# Patient Record
Sex: Female | Born: 1995 | Race: White | Hispanic: No | Marital: Single | Smoking: Never smoker
Health system: Southern US, Community
[De-identification: ages and names within clinical notes are randomized; demographics above are authoritative.]

---
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
## 2017-04-05 ENCOUNTER — Ambulatory Visit (INDEPENDENT_AMBULATORY_CARE_PROVIDER_SITE_OTHER): Payer: BLUE CROSS/BLUE SHIELD | Admitting: Family Medicine

## 2017-04-05 VITALS — BP 124/77 | HR 93 | Temp 98.9°F | Resp 14

## 2017-04-05 DIAGNOSIS — J029 Acute pharyngitis, unspecified: Secondary | ICD-10-CM

## 2017-04-05 DIAGNOSIS — R109 Unspecified abdominal pain: Secondary | ICD-10-CM

## 2017-04-05 LAB — POCT URINE PREGNANCY: Preg Test, Ur: NEGATIVE

## 2017-04-05 LAB — POCT RAPID STREP A (OFFICE): RAPID STREP A SCREEN: NEGATIVE

## 2017-04-05 NOTE — Progress Notes (Signed)
Patient presents today with symptoms of lower abdominal pain. Patient states that her last menstrual cycle was a week ago. She states that she has had the lower abdominal pain for about 3 days. She denies a focal area of pain. She denies any diarrhea or vomiting. She has felt nauseous at times. She denies any constipation or vaginal discharge. She is sexually active however does use protection. She does also have a mild sore throat. She rates this a 4 out of 10 on the pain scale. Her abdominal pain is about 3 out of 10 on the pain scale. She denies any history of mono in the past. She has had postnasal drip related to seasonal allergies. She denies any urinary symptoms such as frequency or dysuria. She has had some decreased appetite.  ROS: Negative except mentioned above. Vitals as per Epic.  GENERAL: NAD HEENT: mild pharyngeal erythema, no exudate, no erythema of TMs, mild right cervical LAD RESP: CTA B CARD: RRR ABD: +BS, NT, no rebound or guarding, no flank tenderness  NEURO: CN II-XII grossly intact   Urine Dip - leukocytes negative, nitrites negative, 1+ protein, pH 6.5, negative blood, specific gravity 1.010, trace ketones, negative glucose  A/P: Abdominal pain, sore throat - rapid strep test was negative in the office, urine dip was not indicative of a UTI, urine pregnancy was negative, will send further labs. I've encourage the patient to monitor her symptoms closely and to probably go to the ER if her symptoms were to worsen such as right lower quadrant pain, vomiting. No athletic activity for now. Tylenol when necessary.

## 2017-04-08 LAB — COMPREHENSIVE METABOLIC PANEL
ALK PHOS: 89 IU/L (ref 39–117)
ALT: 15 IU/L (ref 0–32)
AST: 22 IU/L (ref 0–40)
Albumin/Globulin Ratio: 1.5 (ref 1.2–2.2)
Albumin: 4.4 g/dL (ref 3.5–5.5)
BUN/Creatinine Ratio: 11 (ref 9–23)
BUN: 10 mg/dL (ref 6–20)
Bilirubin Total: 0.3 mg/dL (ref 0.0–1.2)
CO2: 22 mmol/L (ref 18–29)
CREATININE: 0.9 mg/dL (ref 0.57–1.00)
Calcium: 9.1 mg/dL (ref 8.7–10.2)
Chloride: 99 mmol/L (ref 96–106)
GFR calc Af Amer: 107 mL/min/{1.73_m2} (ref >59)
GFR calc non Af Amer: 93 mL/min/{1.73_m2} (ref >59)
GLOBULIN, TOTAL: 2.9 g/dL (ref 1.5–4.5)
GLUCOSE: 90 mg/dL (ref 65–99)
Potassium: 4.2 mmol/L (ref 3.5–5.2)
SODIUM: 136 mmol/L (ref 134–144)
Total Protein: 7.3 g/dL (ref 6.0–8.5)

## 2017-04-08 LAB — CBC WITH DIFFERENTIAL/PLATELET
BASOS ABS: 0 10*3/uL (ref 0.0–0.2)
Basos: 1 %
EOS (ABSOLUTE): 0 10*3/uL (ref 0.0–0.4)
EOS: 0 %
Hematocrit: 36.6 % (ref 34.0–46.6)
Hemoglobin: 12.5 g/dL (ref 11.1–15.9)
Immature Grans (Abs): 0 10*3/uL (ref 0.0–0.1)
Immature Granulocytes: 0 %
LYMPHS ABS: 0.5 10*3/uL — AB (ref 0.7–3.1)
Lymphs: 18 %
MCH: 29.8 pg (ref 26.6–33.0)
MCHC: 34.2 g/dL (ref 31.5–35.7)
MCV: 87 fL (ref 79–97)
Monocytes Absolute: 0.3 10*3/uL (ref 0.1–0.9)
Monocytes: 10 %
Neutrophils Absolute: 2.1 10*3/uL (ref 1.4–7.0)
Neutrophils: 71 %
PLATELETS: 170 10*3/uL (ref 150–379)
RBC: 4.19 x10E6/uL (ref 3.77–5.28)
RDW: 13 % (ref 12.3–15.4)
WBC: 3 10*3/uL — AB (ref 3.4–10.8)

## 2017-04-08 LAB — URINALYSIS, ROUTINE W REFLEX MICROSCOPIC
Bilirubin, UA: NEGATIVE
GLUCOSE, UA: NEGATIVE
KETONES UA: NEGATIVE
Leukocytes, UA: NEGATIVE
NITRITE UA: NEGATIVE
Protein, UA: NEGATIVE
RBC, UA: NEGATIVE
SPEC GRAV UA: 1.02 (ref 1.005–1.030)
Urobilinogen, Ur: 0.2 mg/dL (ref 0.2–1.0)
pH, UA: 5.5 (ref 5.0–7.5)

## 2017-04-08 LAB — CULTURE, GROUP A STREP: Strep A Culture: NEGATIVE

## 2017-04-08 LAB — URINE CULTURE

## 2017-04-08 LAB — MONONUCLEOSIS SCREEN: MONO SCREEN: NEGATIVE

## 2017-04-09 ENCOUNTER — Ambulatory Visit (INDEPENDENT_AMBULATORY_CARE_PROVIDER_SITE_OTHER): Payer: BLUE CROSS/BLUE SHIELD | Admitting: Family Medicine

## 2017-04-09 ENCOUNTER — Encounter: Payer: Self-pay | Admitting: Family Medicine

## 2017-04-09 VITALS — BP 117/74 | HR 67 | Temp 97.7°F | Resp 14

## 2017-04-09 DIAGNOSIS — J029 Acute pharyngitis, unspecified: Secondary | ICD-10-CM

## 2017-04-09 NOTE — Progress Notes (Signed)
Patient presents today with persistent symptoms of sore throat, fatigue, fever. [facility] on [facility] and was given Ceftin for a sinus infection. When patient saw me on Thursday her labs reflected likely viral infection. Her Monospot test was negative. Her throat culture was negative. Patient has been taking Tylenol and Ibuprofen. She denies any previous allergies to any antibiotics in the past. She denies any abdominal pain.  ROS: Negative except mentioned above. Vitals as per Epic. GENERAL: NAD HEENT: Mild to moderate pharyngeal erythema, no exudate, no erythema of TMs, mild right cervical LAD RESP: CTA B CARD: RRR  ABD: Positive bowel sounds, nontender, no organomegaly appreciated SKIN: fine generalized erythematous rash on body  NEURO: CN II-XII grossly intact   A/P: Pharyngitis, Fatigue - rash appears to be likely related to viral exanthem and the results of the virus being treated with an antibiotic, discussed this with patient, likely not an allergy to Ceftin however would not use medication again, this was discussed with patient, will repeat CBC and will do EBV titers. Encourage patient to take Tylenol and Ibuprofen still as needed. No athletic activity for now until EBV titers have been reviewed. Seek medical attention if symptoms persist or worsen as discussed.

## 2017-04-10 LAB — CBC WITH DIFFERENTIAL/PLATELET
BASOS ABS: 0.2 10*3/uL (ref 0.0–0.2)
BASOS: 5 %
EOS (ABSOLUTE): 0.1 10*3/uL (ref 0.0–0.4)
Eos: 2 %
HEMATOCRIT: 37 % (ref 34.0–46.6)
Hemoglobin: 12.8 g/dL (ref 11.1–15.9)
IMMATURE GRANS (ABS): 0 10*3/uL (ref 0.0–0.1)
Immature Granulocytes: 0 %
LYMPHS ABS: 1.4 10*3/uL (ref 0.7–3.1)
LYMPHS: 42 %
MCH: 29.2 pg (ref 26.6–33.0)
MCHC: 34.6 g/dL (ref 31.5–35.7)
MCV: 85 fL (ref 79–97)
MONOCYTES: 14 %
Monocytes Absolute: 0.4 10*3/uL (ref 0.1–0.9)
Neutrophils Absolute: 1.2 10*3/uL — ABNORMAL LOW (ref 1.4–7.0)
Neutrophils: 37 %
Platelets: 180 10*3/uL (ref 150–379)
RBC: 4.38 x10E6/uL (ref 3.77–5.28)
RDW: 12.3 % (ref 12.3–15.4)
WBC: 3.3 10*3/uL — ABNORMAL LOW (ref 3.4–10.8)

## 2017-04-10 LAB — EPSTEIN-BARR VIRUS VCA ANTIBODY PANEL
EBV Early Antigen Ab, IgG: <9 U/mL (ref 0.0–8.9)
EBV VCA IgM: <36 U/mL (ref 0.0–35.9)

## 2017-09-20 ENCOUNTER — Ambulatory Visit (INDEPENDENT_AMBULATORY_CARE_PROVIDER_SITE_OTHER): Payer: BLUE CROSS/BLUE SHIELD | Admitting: Family Medicine

## 2017-09-20 DIAGNOSIS — M545 Low back pain, unspecified: Secondary | ICD-10-CM

## 2017-09-20 MED ORDER — CYCLOBENZAPRINE HCL 5 MG PO TABS: 5.0000 mg | ORAL_TABLET | Freq: Two times a day (BID) | ORAL | 0 refills | Status: AC | PRN

## 2017-09-20 MED ORDER — NAPROXEN 500 MG PO TABS: 500.0000 mg | ORAL_TABLET | Freq: Two times a day (BID) | ORAL | 0 refills | Status: AC

## 2017-09-20 NOTE — Progress Notes (Signed)
Patient presents today with symptoms of left-sided back pain. Patient states that she was elbowed in the back during a soccer game which then flew her body forward landing on her hands. Since that incident a few days ago she has noticed left-sided lower back pain and tightness. She denies any one particular movement causing her more discomfort. She denies any radicular symptoms into her lower extremities. She states that she did have sciatica when she was in high school but denies ever having any imaging such as x-ray or MRI before. She denies any incontinence, fever. She has not been taking medications for her symptoms. She denies any UTI symptoms or flank tenderness.  ROS: Negative except mentioned above.  GENERAL: NAD RESP: CTA B CARD: RRR MSK: No midline tenderness of lumbar area, mild paravertebral tenderness noted in the upper lumbar area left greater than right, no tenderness in the SI or gluteal region bilaterally, mild spasm, full range of motion, negative straight leg raise, mildly tight hamstrings, normal toe and heel walk, 5 out of 5 strength of lower extremities, NV intact NEURO: CN II-XII grossly intact   A/P: Lower back strain/spasm - will treat with Naprosyn and Flexeril for now, if symptoms do persist or worsen would then do imaging, patient addresses understanding of this, modify activity for now, advance activity as tolerated, work on hamstring flexibility and core strength, will seek medical attention if anything changes or worsens. Will discuss above with trainer.

## 2018-02-19 ENCOUNTER — Encounter: Payer: Self-pay | Admitting: Family Medicine

## 2018-02-19 ENCOUNTER — Ambulatory Visit (INDEPENDENT_AMBULATORY_CARE_PROVIDER_SITE_OTHER): Payer: BLUE CROSS/BLUE SHIELD | Admitting: Family Medicine

## 2018-02-19 VITALS — BP 126/77 | HR 62 | Temp 98.0°F | Resp 14

## 2018-02-19 DIAGNOSIS — J209 Acute bronchitis, unspecified: Secondary | ICD-10-CM

## 2018-02-19 MED ORDER — ALBUTEROL SULFATE HFA 108 (90 BASE) MCG/ACT IN AERS: 2.0000 | INHALATION_SPRAY | Freq: Four times a day (QID) | RESPIRATORY_TRACT | 0 refills | Status: DC | PRN

## 2018-02-19 MED ORDER — AZITHROMYCIN 250 MG PO TABS: ORAL_TABLET | ORAL | 0 refills | Status: AC

## 2018-02-19 NOTE — Progress Notes (Signed)
Patient presents today with symptoms of cough, nasal congestion, wheezing at times. Patient states that she's had the cough for about 2 weeks. She denies having fever, chills, myalgias, fatigue. She denies any sore throat, ear pain, abdominal pain, nausea, vomiting, diarrhea. She denies any history of asthma. She does have chronic allergies. She has not been taking any allergy medication. She has been trying to take Mucinex which has not been helping much. She does have colored mucus. She has noticed some chest tightness with exercise and increased coughing.  ROS: Negative except mentioned above. Vitals as per Epic. GENERAL: NAD HEENT: no pharyngeal erythema, no exudate, no erythema of TMs, no cervical LAD RESP: CTA B CARD: RRR NEURO: CN II-XII grossly intact   A/P: Bronchitis - will treat with Z-Pak, Albuterol Inhaler when necessary, Delsym when necessary, start Claritin for allergies, rest, hydration, seek medical attention if symptoms persist or worsen as discussed. Athletic activity as tolerated. No athletic activity or classic febrile.

## 2018-03-13 ENCOUNTER — Other Ambulatory Visit: Payer: Self-pay | Admitting: Family Medicine

## 2019-09-10 ENCOUNTER — Other Ambulatory Visit: Payer: Self-pay

## 2019-09-10 DIAGNOSIS — Z20822 Contact with and (suspected) exposure to covid-19: Secondary | ICD-10-CM

## 2019-09-12 LAB — NOVEL CORONAVIRUS, NAA: SARS-CoV-2, NAA: NOT DETECTED

## 2019-11-03 ENCOUNTER — Other Ambulatory Visit: Payer: Self-pay | Admitting: Family Medicine

## 2019-11-03 ENCOUNTER — Other Ambulatory Visit: Payer: Self-pay

## 2019-11-03 ENCOUNTER — Ambulatory Visit
Admission: RE | Admit: 2019-11-03 | Discharge: 2019-11-03 | Disposition: A | Discharge status: Home / Self Care | Payer: BC Managed Care – PPO | Source: Ambulatory Visit | Attending: Family Medicine | Admitting: Family Medicine

## 2019-11-03 DIAGNOSIS — S8992XA Unspecified injury of left lower leg, initial encounter: Secondary | ICD-10-CM

## 2019-11-04 ENCOUNTER — Other Ambulatory Visit: Payer: Self-pay | Admitting: Family Medicine

## 2019-11-04 DIAGNOSIS — S8992XA Unspecified injury of left lower leg, initial encounter: Secondary | ICD-10-CM

## 2019-11-07 ENCOUNTER — Other Ambulatory Visit: Payer: Self-pay

## 2019-11-07 ENCOUNTER — Ambulatory Visit
Admission: RE | Admit: 2019-11-07 | Discharge: 2019-11-07 | Disposition: A | Discharge status: Home / Self Care | Payer: BC Managed Care – PPO | Source: Ambulatory Visit | Attending: Family Medicine | Admitting: Family Medicine

## 2019-11-07 DIAGNOSIS — S8992XA Unspecified injury of left lower leg, initial encounter: Secondary | ICD-10-CM | POA: Insufficient documentation

## 2020-02-02 ENCOUNTER — Other Ambulatory Visit: Payer: Self-pay | Admitting: Family Medicine

## 2020-02-02 DIAGNOSIS — I400 Infective myocarditis: Secondary | ICD-10-CM

## 2020-02-02 DIAGNOSIS — Z8616 Personal history of COVID-19: Secondary | ICD-10-CM

## 2020-02-02 DIAGNOSIS — U071 COVID-19: Secondary | ICD-10-CM

## 2020-02-09 ENCOUNTER — Ambulatory Visit (INDEPENDENT_AMBULATORY_CARE_PROVIDER_SITE_OTHER): Payer: BC Managed Care – PPO

## 2020-02-09 ENCOUNTER — Other Ambulatory Visit
Admission: RE | Admit: 2020-02-09 | Discharge: 2020-02-09 | Disposition: A | Discharge status: Home / Self Care | Payer: BC Managed Care – PPO | Source: Ambulatory Visit | Attending: Family Medicine | Admitting: Family Medicine

## 2020-02-09 ENCOUNTER — Other Ambulatory Visit: Payer: Self-pay

## 2020-02-09 DIAGNOSIS — U071 COVID-19: Secondary | ICD-10-CM

## 2020-02-09 DIAGNOSIS — Z8616 Personal history of COVID-19: Secondary | ICD-10-CM

## 2020-02-09 DIAGNOSIS — I400 Infective myocarditis: Secondary | ICD-10-CM | POA: Diagnosis not present

## 2020-02-09 LAB — TROPONIN I (HIGH SENSITIVITY): Troponin I (High Sensitivity): <2 ng/L (ref <18)

## 2020-03-15 ENCOUNTER — Ambulatory Visit: Payer: BC Managed Care – PPO | Attending: Internal Medicine

## 2020-03-15 DIAGNOSIS — Z23 Encounter for immunization: Secondary | ICD-10-CM

## 2020-03-15 NOTE — Progress Notes (Signed)
   Covid-19 Vaccination Clinic  Name:  Katherine Strickland    MRN: [id number] DOB: 1996/10/05  03/15/2020  [name] was observed post Covid-19 immunization for 15 minutes without incident. She was provided with Vaccine Information Sheet and instruction to access the V-Safe system.   [name] was instructed to call 911 with any severe reactions post vaccine: Marland Kitchen Difficulty breathing  . Swelling of face and throat  . A fast heartbeat  . A bad rash all over body  . Dizziness and weakness   Immunizations Administered    Name Date Dose VIS Date Route   Pfizer COVID-19 Vaccine 03/15/2020  6:23 PM 0.3 mL 11/28/2019 Intramuscular   Manufacturer: [employer], [employer]   Lot: [id number]   NDC: [address]-0

## 2020-04-05 ENCOUNTER — Ambulatory Visit: Payer: BC Managed Care – PPO | Attending: Internal Medicine

## 2020-04-05 DIAGNOSIS — Z23 Encounter for immunization: Secondary | ICD-10-CM

## 2020-04-05 NOTE — Progress Notes (Signed)
   Covid-19 Vaccination Clinic  Name:  Katherine Strickland    MRN: [id number] DOB: 12-12-1996  04/05/2020  Katherine Strickland was observed post Covid-19 immunization for 15 minutes without incident. She was provided with Vaccine Information Sheet and instruction to access the V-Safe system.   Katherine Strickland was instructed to call 911 with any severe reactions post vaccine: Marland Kitchen Difficulty breathing  . Swelling of face and throat  . A fast heartbeat  . A bad rash all over body  . Dizziness and weakness   Immunizations Administered    Name Date Dose VIS Date Route   Pfizer COVID-19 Vaccine 04/05/2020  5:03 PM 0.3 mL 02/11/2019 Intramuscular   Manufacturer: [employer], [employer]   Lot: [id number]   NDC: [address]-1

## 2021-01-10 IMAGING — MR MR KNEE*L* W/O CM
6 series · 40 of 40 positions shown · non-contrast
Comparison: Radiograph November 03, 2019

CLINICAL DATA: Left knee injury

EXAM:
MRI OF THE LEFT KNEE WITHOUT CONTRAST
TECHNIQUE: Multiplanar, multisequence MR imaging of the knee was performed. No
intravenous contrast was administered.

[Series 8: T2 fat-sat · axial · left · 4.0mm · 0.50mm/px · z∈[-92,+32]mm · 7 of 26 slices shown (1 of 3)]
[im 1/26]
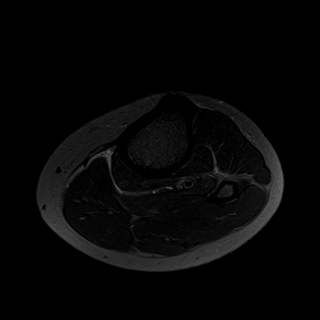
[im 5/26]
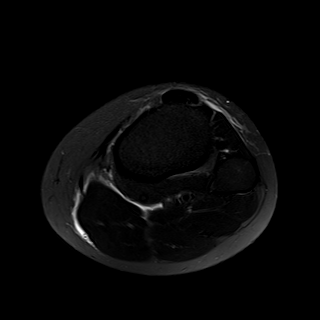
[im 9/26]
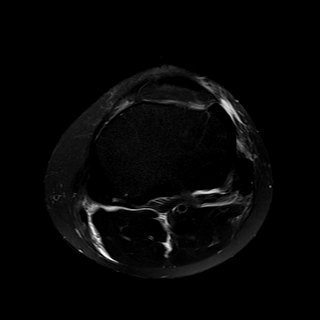
[im 13/26]
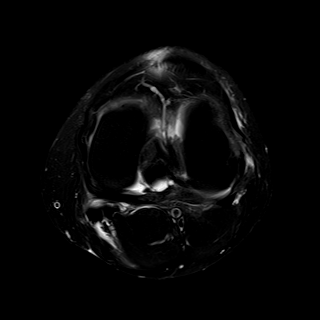
[im 17/26]
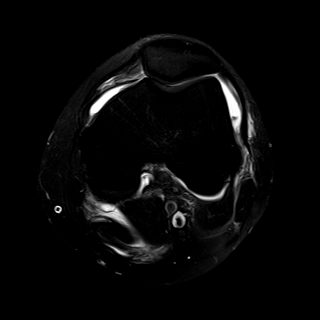
[im 21/26]
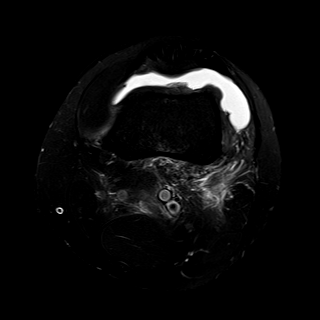
[im 26/26]
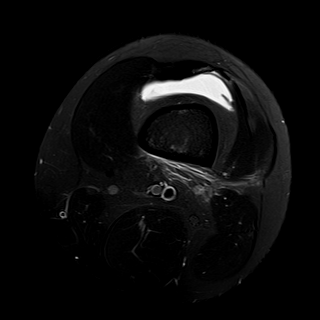

[Series 9: PD fat-sat · sagittal · left · 3.0mm · 0.59mm/px · 8 of 29 slices shown (1 of 2)]
[im 1/29]
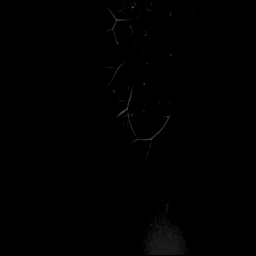
[im 5/29]
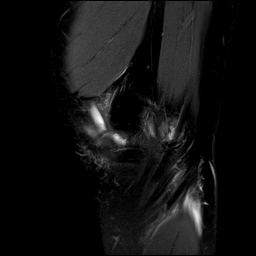
[im 9/29]
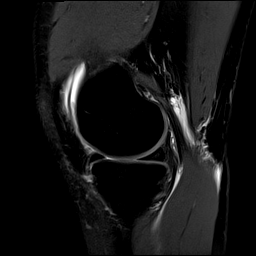
[im 13/29]
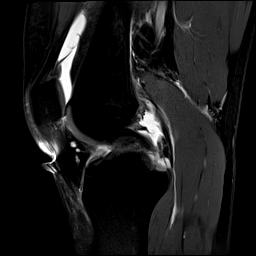
[im 17/29]
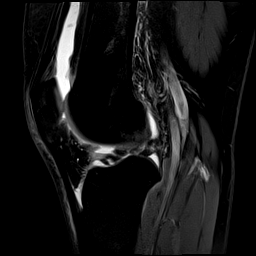
[im 21/29]
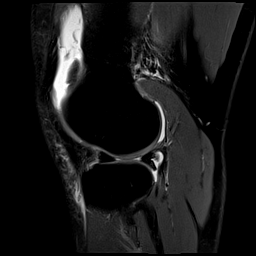
[im 25/29]
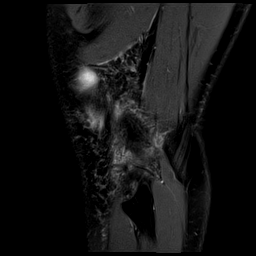
[im 29/29]
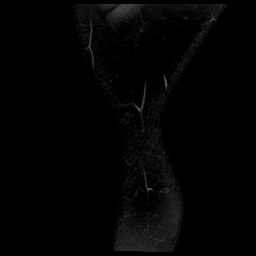

[Series 10: T2 fat-sat · coronal · left · 4.0mm · 0.59mm/px · 6 of 26 slices shown (2 of 3)]
[im 1/26]
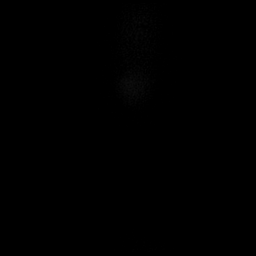
[im 6/26]
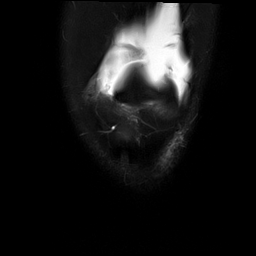
[im 11/26]
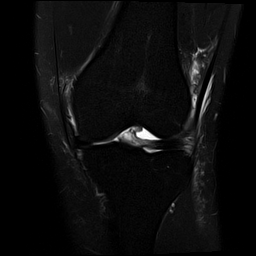
[im 16/26]
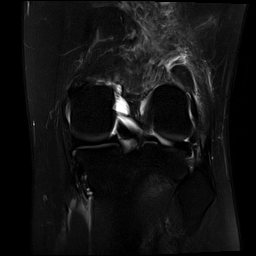
[im 21/26]
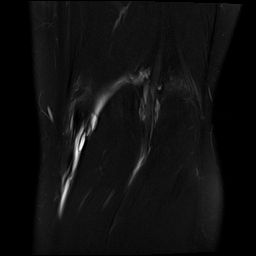
[im 26/26]
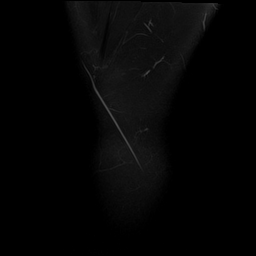

[Series 11: T1 · coronal · left · 4.0mm · 0.59mm/px · 6 of 26 slices shown]
[im 1/26]
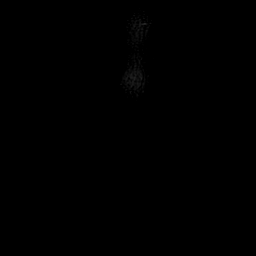
[im 6/26]
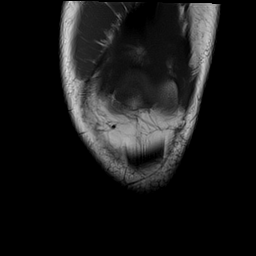
[im 11/26]
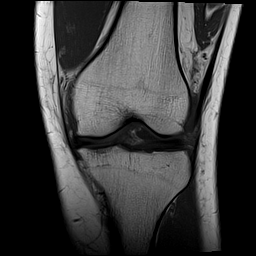
[im 16/26]
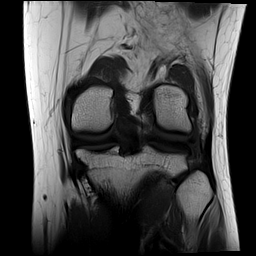
[im 21/26]
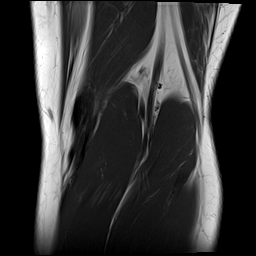
[im 26/26]
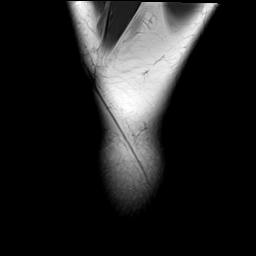

[Series 12: PD fat-sat · coronal · left · 4.0mm · 0.59mm/px · 6 of 26 slices shown (2 of 2)]
[im 1/26]
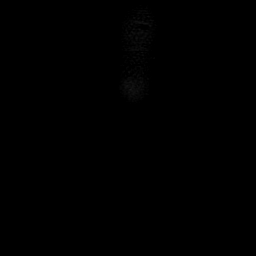
[im 6/26]
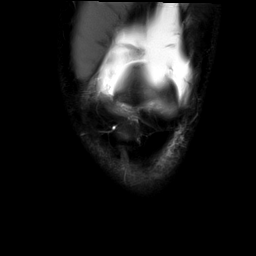
[im 11/26]
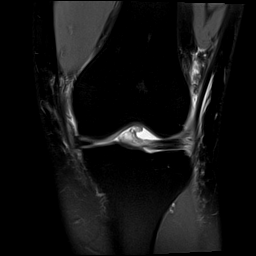
[im 16/26]
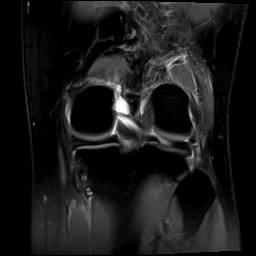
[im 21/26]
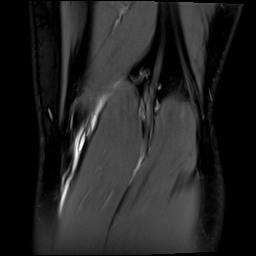
[im 26/26]
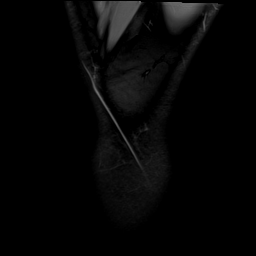

[Series 13: T2 fat-sat · sagittal · left · 3.0mm · 0.59mm/px · 7 of 30 slices shown (3 of 3)]
[im 1/30]
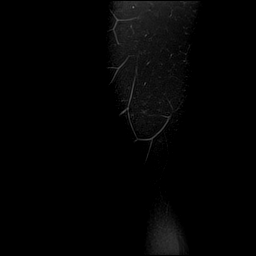
[im 5/30]
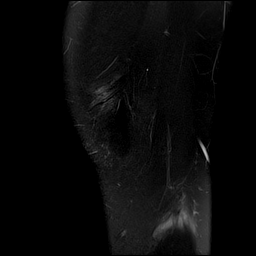
[im 10/30]
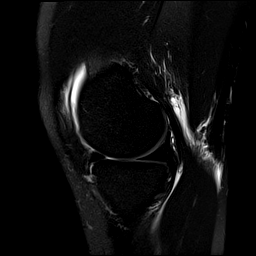
[im 15/30]
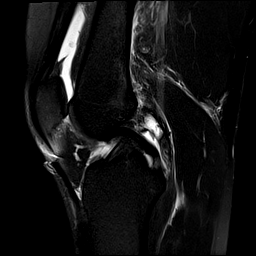
[im 20/30]
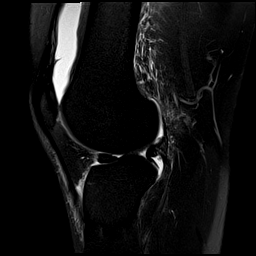
[im 25/30]
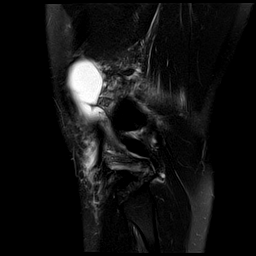
[im 30/30]
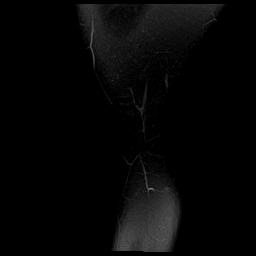

[40 of 40 positions shown; findings below may reference images not displayed]

FINDINGS: MENISCI

Medial: Intact.

Lateral: There is a complex bucket-handle tear of the posterior
lateral meniscus which is flipped upward into the intercondylar
notch posteriorly and extends to the anterior horn of the lateral
meniscus at the root junction. Within the intercondylar notch
anteriorly there is a small amount of fluid and probable paralabral
cyst.

LIGAMENTS

Cruciates: There is heterogeneous signal seen at the tibial
insertion site of the ACL, however there are intact fibers seen
throughout. The PCL is intact.

Collaterals: The MCL is intact. Increased intrasubstance signal seen
around the fibular collateral ligament with partial interstitial
tear.

CARTILAGE

Patellofemoral: Normal.

Medial compartment: Normal.

Lateral compartment: Normal.

BONES: No fracture. No avascular necrosis. No pathologic marrow
infiltration.

JOINT: A moderate knee joint effusion with scattered debris seen
superiorly. Edema seen within Hoffa's fat pad. No plical thickening.

EXTENSOR MECHANISM: The patellar and quadriceps tendon are intact. A
small amount infrapatellar bursal fluid is seen.

POPLITEAL FOSSA: A loculated popliteal cyst is present with evidence
of recent partial rupture.

OTHER: The visualized muscles are normal in appearance. Edema seen
within the popliteal fossa and around the posterolateral corner of
the knee.
IMPRESSION: 1. Complex bucket-handle tear of the posterior lateral meniscus
which is flipped upward into the intracondylar notch the probable
adjacent paralabral cyst.
2. Intrasubstance sprain of the ACL.
3. Partial interstitial tear and intrasubstance sprain of the
fibular collateral ligament with posterolateral corner edema.
4. Moderate knee joint effusion with synovitis
5. Loculated popliteal cyst with evidence of recent partial rupture
